# Patient Record
Sex: Male | Born: 1985 | Race: Black or African American | Hispanic: No | Marital: Single | Smoking: Never smoker
Health system: Southern US, Community
[De-identification: ages and names within clinical notes are randomized; demographics above are authoritative.]

---

## 2016-11-05 ENCOUNTER — Emergency Department (HOSPITAL_COMMUNITY): Payer: Self-pay

## 2016-11-05 ENCOUNTER — Emergency Department (HOSPITAL_COMMUNITY)
Admission: EM | Admit: 2016-11-05 | Discharge: 2016-11-05 | Disposition: A | Discharge status: Home / Self Care | Payer: Self-pay | Attending: Emergency Medicine | Admitting: Emergency Medicine

## 2016-11-05 ENCOUNTER — Encounter (HOSPITAL_COMMUNITY): Payer: Self-pay | Admitting: *Deleted

## 2016-11-05 DIAGNOSIS — Y9389 Activity, other specified: Secondary | ICD-10-CM | POA: Insufficient documentation

## 2016-11-05 DIAGNOSIS — S81831A Puncture wound without foreign body, right lower leg, initial encounter: Secondary | ICD-10-CM

## 2016-11-05 DIAGNOSIS — Z23 Encounter for immunization: Secondary | ICD-10-CM | POA: Insufficient documentation

## 2016-11-05 DIAGNOSIS — Y999 Unspecified external cause status: Secondary | ICD-10-CM | POA: Insufficient documentation

## 2016-11-05 DIAGNOSIS — S81801A Unspecified open wound, right lower leg, initial encounter: Secondary | ICD-10-CM | POA: Insufficient documentation

## 2016-11-05 DIAGNOSIS — Y92240 Courthouse as the place of occurrence of the external cause: Secondary | ICD-10-CM | POA: Insufficient documentation

## 2016-11-05 DIAGNOSIS — W3400XA Accidental discharge from unspecified firearms or gun, initial encounter: Secondary | ICD-10-CM

## 2016-11-05 MED ORDER — TRAMADOL HCL 50 MG PO TABS: 50.0000 mg | ORAL_TABLET | Freq: Four times a day (QID) | ORAL | 0 refills | Status: AC | PRN

## 2016-11-05 MED ORDER — TETANUS-DIPHTH-ACELL PERTUSSIS 5-2.5-18.5 LF-MCG/0.5 IM SUSP
0.5000 mL | Freq: Once | INTRAMUSCULAR | Status: AC
  Administered 2016-11-05: 0.5 mL via INTRAMUSCULAR
  Filled 2016-11-05: qty 0.5

## 2016-11-05 MED ORDER — CEPHALEXIN 500 MG PO CAPS: 500.0000 mg | ORAL_CAPSULE | Freq: Four times a day (QID) | ORAL | 0 refills | Status: AC

## 2016-11-05 MED ORDER — SODIUM CHLORIDE 0.9 % IV SOLN
INTRAVENOUS | Status: DC
  Administered 2016-11-05: 14:00:00 via INTRAVENOUS

## 2016-11-05 MED ORDER — HYDROMORPHONE HCL 1 MG/ML IJ SOLN
1.0000 mg | Freq: Once | INTRAMUSCULAR | Status: AC
  Administered 2016-11-05: 1 mg via INTRAVENOUS
  Filled 2016-11-05: qty 1

## 2016-11-05 MED ORDER — CEFAZOLIN SODIUM-DEXTROSE 1-4 GM/50ML-% IV SOLN
1.0000 g | Freq: Once | INTRAVENOUS | Status: AC
  Administered 2016-11-05: 1 g via INTRAVENOUS
  Filled 2016-11-05: qty 50
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
## 2016-11-05 NOTE — Progress Notes (Signed)
Orthopedic Tech Progress Note Patient Details:  Felipe DroneChristopher XXXBlue 07/02/1985 [id number]  Ortho Devices Type of Ortho Device: Crutches Ortho Device/Splint Interventions: Ordered, Adjustment   Jennye MoccasinHughes, Mixtli Reno Craig 11/05/2016, 2:07 PM
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
## 2018-11-08 DEATH — deceased
